# Patient Record
Sex: Female | Born: 2000
Health system: Southern US, Community
[De-identification: ages and names within clinical notes are randomized; demographics above are authoritative.]

## PROBLEM LIST (undated history)

## (undated) DIAGNOSIS — L309 Dermatitis, unspecified: Secondary | ICD-10-CM

## (undated) DIAGNOSIS — J45909 Unspecified asthma, uncomplicated: Secondary | ICD-10-CM

## (undated) DIAGNOSIS — T148XXA Other injury of unspecified body region, initial encounter: Secondary | ICD-10-CM

## (undated) HISTORY — PX: HERNIA REPAIR: SHX51

---
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
## 2021-05-22 ENCOUNTER — Emergency Department (HOSPITAL_COMMUNITY)
Admission: EM | Admit: 2021-05-22 | Discharge: 2021-05-22 | Disposition: A | Discharge status: Home / Self Care | Payer: Medicaid Other | Attending: Emergency Medicine | Admitting: Emergency Medicine

## 2021-05-22 ENCOUNTER — Encounter (HOSPITAL_COMMUNITY): Payer: Self-pay

## 2021-05-22 DIAGNOSIS — Z20822 Contact with and (suspected) exposure to covid-19: Secondary | ICD-10-CM | POA: Diagnosis not present

## 2021-05-22 DIAGNOSIS — J029 Acute pharyngitis, unspecified: Secondary | ICD-10-CM | POA: Diagnosis not present

## 2021-05-22 DIAGNOSIS — M791 Myalgia, unspecified site: Secondary | ICD-10-CM | POA: Diagnosis not present

## 2021-05-22 LAB — RESP PANEL BY RT-PCR (FLU A&B, COVID) ARPGX2
Influenza A by PCR: NEGATIVE
Influenza B by PCR: NEGATIVE
SARS Coronavirus 2 by RT PCR: NEGATIVE

## 2021-05-22 LAB — GROUP A STREP BY PCR: Group A Strep by PCR: NOT DETECTED

## 2021-05-22 MED ORDER — LIDOCAINE VISCOUS HCL 2 % MT SOLN
15.0000 mL | Freq: Once | OROMUCOSAL | Status: AC
  Administered 2021-05-22: 15 mL via ORAL
  Filled 2021-05-22: qty 15

## 2021-05-22 MED ORDER — ALUM & MAG HYDROXIDE-SIMETH 200-200-20 MG/5ML PO SUSP
30.0000 mL | Freq: Once | ORAL | Status: AC
  Administered 2021-05-22: 30 mL via ORAL
  Filled 2021-05-22: qty 30

## 2021-05-22 MED ORDER — PENICILLIN G BENZATHINE 1200000 UNIT/2ML IM SUSY
1.2000 10*6.[IU] | PREFILLED_SYRINGE | Freq: Once | INTRAMUSCULAR | Status: AC
  Administered 2021-05-22: 1.2 10*6.[IU] via INTRAMUSCULAR
  Filled 2021-05-22: qty 2

## 2021-05-22 MED ORDER — PREDNISONE 20 MG PO TABS
60.0000 mg | ORAL_TABLET | Freq: Once | ORAL | Status: AC
  Administered 2021-05-22: 60 mg via ORAL
  Filled 2021-05-22: qty 3

## 2021-05-22 NOTE — ED Provider Notes (Signed)
?[facility] DEPT ?Provider Note ? ? ?CSN: [id number] ?Arrival date & time: 05/22/21  1757 ? ?  ? ?History ? ?Chief Complaint  ?Patient presents with  ? Generalized Body Aches  ? ? ?Saanvika Mcphaul is a 21 y.o. female. ? ?21 y.o female with no PMH presents to the ED with a chief complaint of body aches, sore throat that is been ongoing for the past 2 nights.  Reports worsening pain with any swelling, any type of eating.  Also reports consistent body aches, decrease in p.o. intake.  Has taken Tylenol for pain without much improvement in symptoms.  Patient does have 2 younger siblings at home who are not sick with the same symptoms do go to school.  She denies any cough.  No chest pain, no shortness of breath, no dental complaints. ? ?The history is provided by the patient and medical records.  ? ?  ? ?Home Medications ?Prior to Admission medications   ?Medication Sig Start Date End Date Taking? Authorizing Provider  ?albuterol (VENTOLIN HFA) 108 (90 Base) MCG/ACT inhaler Inhale 1-2 puffs into the lungs every 6 (six) hours as needed for wheezing or shortness of breath. 04/01/19   Jaynee Eagles, PA-C  ?ibuprofen (ADVIL) 800 MG tablet Take 1 tablet (800 mg total) by mouth 3 (three) times daily. 11/13/20   Pearson Forster, NP  ?   ? ?Allergies    ?Patient has no known allergies.   ? ?Review of Systems   ?Review of Systems  ?Constitutional:  Positive for chills. Negative for fever.  ?HENT:  Positive for sore throat.   ?Respiratory:  Negative for cough and shortness of breath.   ? ?Physical Exam ?Updated Vital Signs ?BP 121/85 (BP Location: Left Arm)   Pulse (!) 118   Temp 98.6 ?F (37 ?C) (Oral)   Resp 18   Ht 5\' 4"  (1.626 m)   Wt 110.7 kg   SpO2 100%   BMI 41.88 kg/m?  ?Physical Exam ?Vitals and nursing note reviewed.  ?Constitutional:   ?   Appearance: She is ill-appearing.  ?HENT:  ?   Head: Normocephalic and atraumatic.  ?   Mouth/Throat:  ?   Mouth: Mucous membranes are moist.  ?    Pharynx: Uvula midline. Oropharyngeal exudate and posterior oropharyngeal erythema present.  ?   Tonsils: Tonsillar exudate present. 3+ on the right. 3+ on the left.  ?   Comments: 3 + symmetric, kissing the uvula which is midline.  Tolerating her secretions.  No PTA noted.  Cervical adenopathy palpated. ?Cardiovascular:  ?   Rate and Rhythm: Normal rate.  ?Pulmonary:  ?   Effort: Pulmonary effort is normal.  ?Abdominal:  ?   Tenderness: There is no abdominal tenderness.  ?Musculoskeletal:  ?   Cervical back: Normal range of motion and neck supple.  ?Skin: ?   General: Skin is warm and dry.  ?Neurological:  ?   Mental Status: She is alert and oriented to person, place, and time.  ? ? ?ED Results / Procedures / Treatments   ?Labs ?(all labs ordered are listed, but only abnormal results are displayed) ?Labs Reviewed  ?RESP PANEL BY RT-PCR (FLU A&B, COVID) ARPGX2  ?GROUP A STREP BY PCR  ? ? ?EKG ?None ? ?Radiology ?No results found. ? ?Procedures ?Procedures  ? ? ?Medications Ordered in ED ?Medications  ?predniSONE (DELTASONE) tablet 60 mg (60 mg Oral Given 05/22/21 1923)  ?penicillin g benzathine (BICILLIN LA) 1200000 UNIT/2ML injection 1.2 Million  Units (1.2 Million Units Intramuscular Given 05/22/21 2138)  ?alum & mag hydroxide-simeth (MAALOX/MYLANTA) 200-200-20 MG/5ML suspension 30 mL (30 mLs Oral Given 05/22/21 2138)  ?  And  ?lidocaine (XYLOCAINE) 2 % viscous mouth solution 15 mL (15 mLs Oral Given 05/22/21 2138)  ? ? ?ED Course/ Medical Decision Making/ A&P ?  ?                        ?Medical Decision Making ?Risk ?OTC drugs. ?Prescription drug management. ? ? ?This patient presents to the ED for concern of sore throat, this involves a number of treatment options, and is a complaint that carries with it a high risk of complications and morbidity.  The differential diagnosis includes PTA, strep pharyngitis, tonsillitis, viral etiology ? ? ?Co morbidities: ?Discussed in HPI ? ? ?Brief History: ? ?Healthy  21 year old female with sore throat for the past 2 days, body aches, decrease in p.o. intake.  Headache, chills. ? ?EMR reviewed including pt PMHx, past surgical history and past visits to ER.  ? ?See HPI for more details ? ?Visualization of her oropharynx reveals erythema, uvula is midline, 3+ tonsils are kissing.  She is tolerating her secretions.  No PTA visualized.  Erythema is noted. ? ? ?Medicines ordered: ? ?I ordered medication including maalox for sore throat, steroids to help with swelling.  Also given Bicillin to help treat her strep pharyngitis. ?Reevaluation of the patient after these medicines showed that the patient improved ?I have reviewed the patients home medicines and have made adjustments as needed ? ?Reevaluation: ? ?After the interventions noted above I re-evaluated patient and found that they have :improved ? ? ?Social Determinants of Health: ? ?The patient's social determinants of health were a factor in the care of this patient ? ? ? ?Problem List / ED Course: ? ?Patient here with worsening throat that is been ongoing for couple days.  Attempted to take over-the-counter medication without much improvement.  Also endorsing some chills.  Oropharynx is remarkable for bilateral exudates, 3+ tonsils symmetric, no PTA visualized.  She is tolerating her secretions adequately.  Tachycardia to the 118's here, no pyrexia present.  Respiratory panel negative for COVID-19, influenza.  Negative for strep pharyngitis.  Although, testing negative today I do feel that patient's symptoms consistent with pharyngitis.  Specially with 2 younger siblings at home.  She was given the shared decision of being treated today with Bicillin versus outpatient antibiotics.  She is agreeable to treatment at this time. ? ? ?Dispostion: ? ?After consideration of the diagnostic results and the patients response to treatment, I feel that the patent would and if it for continuing over-the-counter medication after Bicillin  injection, will need to follow-up with PCP in 1 week.  B ? ?Portions of this note were generated with [employer]. Dictation errors may occur despite best attempts at proofreading.   ?Final Clinical Impression(s) / ED Diagnoses ?Final diagnoses:  ?Sore throat  ? ? ?Rx / DC Orders ?ED Discharge Orders   ? ? None  ? ?  ? ? ?  ?Janeece Fitting, PA-C ?05/22/21 2145 ? ?  ?Davonna Belling, MD ?05/22/21 2329 ? ?

## 2021-05-22 NOTE — Discharge Instructions (Signed)
You were treated for strep pharyngitis on todays visit. You may continue to alternate tylenol and ibuprofen to help with your symptoms. ? ?If you experience any fever, worsening symptoms please return to the ED.  ?

## 2021-12-01 ENCOUNTER — Encounter (HOSPITAL_COMMUNITY): Payer: Self-pay

## 2021-12-01 ENCOUNTER — Ambulatory Visit (HOSPITAL_COMMUNITY)
Admission: RE | Admit: 2021-12-01 | Discharge: 2021-12-01 | Disposition: A | Discharge status: Home / Self Care | Payer: Medicaid Other | Source: Ambulatory Visit | Attending: Physician Assistant | Admitting: Physician Assistant

## 2021-12-01 VITALS — BP 114/79 | HR 73 | Temp 98.5°F | Resp 16

## 2021-12-01 DIAGNOSIS — Z113 Encounter for screening for infections with a predominantly sexual mode of transmission: Secondary | ICD-10-CM

## 2021-12-01 DIAGNOSIS — J069 Acute upper respiratory infection, unspecified: Secondary | ICD-10-CM | POA: Diagnosis not present

## 2021-12-01 DIAGNOSIS — Z20822 Contact with and (suspected) exposure to covid-19: Secondary | ICD-10-CM | POA: Insufficient documentation

## 2021-12-01 DIAGNOSIS — R059 Cough, unspecified: Secondary | ICD-10-CM | POA: Diagnosis not present

## 2021-12-01 LAB — BASIC METABOLIC PANEL
Anion gap: 7 (ref 5–15)
BUN: 8 mg/dL (ref 6–20)
CO2: 23 mmol/L (ref 22–32)
Calcium: 9.2 mg/dL (ref 8.9–10.3)
Chloride: 105 mmol/L (ref 98–111)
Creatinine, Ser: 0.93 mg/dL (ref 0.44–1.00)
GFR, Estimated: >60 mL/min (ref >60)
Glucose, Bld: 97 mg/dL (ref 70–99)
Potassium: 4.1 mmol/L (ref 3.5–5.1)
Sodium: 135 mmol/L (ref 135–145)

## 2021-12-01 LAB — SARS CORONAVIRUS 2 (TAT 6-24 HRS): SARS Coronavirus 2: NEGATIVE

## 2021-12-01 LAB — RPR: RPR Ser Ql: NONREACTIVE

## 2021-12-01 LAB — HIV ANTIBODY (ROUTINE TESTING W REFLEX): HIV Screen 4th Generation wRfx: NONREACTIVE

## 2021-12-01 NOTE — Discharge Instructions (Signed)
We will contact you if your COVID test is positive.  Because of your history of asthma you would be a candidate for Paxlovid.  If you are interested in starting this please let our nurses know when they call you with your result (if positive).  They will not call you if you are negative.  Continue over-the-counter medications.  Make sure you rest and drink plenty fluid.  If symptoms do not improve by next week please return for reevaluation.  If anything worsens you need to be seen immediately.  We will contact you if any of your other testing is positive.  Please abstain from sexual activity until results are available.  You should use condoms to sexual encounter.  If you develop any significant symptoms including abdominal pain, pelvic pain, vaginal discharge you should be seen immediately.

## 2021-12-01 NOTE — ED Provider Notes (Signed)
[facility]    CSN: [id number] Arrival date & time: 12/01/21  0845      History   Chief Complaint Chief Complaint  Patient presents with   Sore Throat    HPI Carolyn Bell is a 21 y.o. female.   Patient presents today with several concerns.  Her primary concern today is a 2-day history of URI symptoms including sore throat, congestion, cough.  Denies any fever, chest pain, shortness of breath, nausea, vomiting, diarrhea.  Reports that she was recently at a family gathering where multiple people have since tested positive for COVID.  She has not had COVID in the past.  She has not had COVID-vaccine.  She has been using over-the-counter medications including ibuprofen without improvement of symptoms.  She does have a history of asthma but has not required albuterol since symptoms began.  Denies any recent steroids or antibiotics.  In addition, patient reports that she is interested in being tested for STIs.  She denies any significant symptoms but reports that she recently had a yeast infection and wants to make sure that this is gone.  She also would like to be tested for all STIs.  She has no specific concerns or exposures.  Denies any significant symptoms.    Past Medical History:  Diagnosis Date   Asthma    Broken bones    Eczema     There are no problems to display for this patient.   Past Surgical History:  Procedure Laterality Date   BREAST REDUCTION SURGERY Bilateral 10/05/2019   Procedure: MAMMARY REDUCTION  (BREAST) left - 1812 grams, right - 1705 grams;  Surgeon: Cindra Presume, MD;  Location: [facility];  Service: Plastics;  Laterality: Bilateral;  2.5 hours, please   HERNIA REPAIR      OB History   No obstetric history on file.      Home Medications    Prior to Admission medications   Medication Sig Start Date End Date Taking? Authorizing Provider  albuterol (VENTOLIN HFA) 108 (90 Base) MCG/ACT inhaler Inhale 1-2  puffs into the lungs every 6 (six) hours as needed for wheezing or shortness of breath. 04/01/19   Jaynee Eagles, PA-C  ibuprofen (ADVIL) 800 MG tablet Take 1 tablet (800 mg total) by mouth 3 (three) times daily. 11/13/20   Pearson Forster, NP    Family History Family History  Problem Relation Age of Onset   Hypertension Mother    Hypertension Maternal Grandmother    Hypertension Maternal Grandfather    Hypertension Paternal Grandfather     Social History Social History   Tobacco Use   Smoking status: Never   Smokeless tobacco: Never  Substance Use Topics   Alcohol use: Yes    Comment: occassionally   Drug use: Yes    Types: Marijuana    Comment: occassional     Allergies   Patient has no known allergies.   Review of Systems Review of Systems  Constitutional:  Positive for activity change. Negative for appetite change, fatigue and fever.  HENT:  Positive for postnasal drip and sore throat. Negative for congestion, sinus pressure and sneezing.   Respiratory:  Positive for cough. Negative for shortness of breath.   Cardiovascular:  Negative for chest pain.  Gastrointestinal:  Negative for abdominal pain, diarrhea, nausea and vomiting.  Genitourinary:  Negative for pelvic pain, vaginal bleeding, vaginal discharge and vaginal pain.  Neurological:  Negative for dizziness, light-headedness and headaches.  Physical Exam Triage Vital Signs ED Triage Vitals  Enc Vitals Group     BP 12/01/21 0935 114/79     Pulse Rate 12/01/21 0931 73     Resp 12/01/21 0931 16     Temp 12/01/21 0931 98.5 F (36.9 C)     Temp Source 12/01/21 0931 Oral     SpO2 12/01/21 0931 97 %     Weight --      Height --      Head Circumference --      Peak Flow --      Pain Score 12/01/21 0933 0     Pain Loc --      Pain Edu? --      Excl. in [place]? --    No data found.  Updated Vital Signs BP 114/79 (BP Location: Left Arm)   Pulse 73   Temp 98.5 F (36.9 C) (Oral)   Resp 16   LMP  11/18/2021   SpO2 97%   Visual Acuity Right Eye Distance:   Left Eye Distance:   Bilateral Distance:    Right Eye Near:   Left Eye Near:    Bilateral Near:     Physical Exam Vitals reviewed.  Constitutional:      General: She is awake. She is not in acute distress.    Appearance: Normal appearance. She is well-developed. She is not ill-appearing.     Comments: Very pleasant female appears stated age in no acute distress sitting comfortably in exam room  HENT:     Head: Normocephalic and atraumatic.     Right Ear: Tympanic membrane, ear canal and external ear normal. Tympanic membrane is not erythematous or bulging.     Left Ear: Tympanic membrane, ear canal and external ear normal. Tympanic membrane is not erythematous or bulging.     Nose:     Right Sinus: No maxillary sinus tenderness or frontal sinus tenderness.     Left Sinus: No maxillary sinus tenderness or frontal sinus tenderness.     Mouth/Throat:     Pharynx: Uvula midline. Posterior oropharyngeal erythema present. No oropharyngeal exudate.  Cardiovascular:     Rate and Rhythm: Normal rate and regular rhythm.     Heart sounds: Normal heart sounds, S1 normal and S2 normal. No murmur heard. Pulmonary:     Effort: Pulmonary effort is normal.     Breath sounds: Normal breath sounds. No wheezing, rhonchi or rales.     Comments: Clear to auscultation bilaterally Psychiatric:        Behavior: Behavior is cooperative.      UC Treatments / Results  Labs (all labs ordered are listed, but only abnormal results are displayed) Labs Reviewed  SARS CORONAVIRUS 2 (TAT 6-24 HRS)  BASIC METABOLIC PANEL  HIV ANTIBODY (ROUTINE TESTING W REFLEX)  RPR  CERVICOVAGINAL ANCILLARY ONLY    EKG   Radiology No results found.  Procedures Procedures (including critical care time)  Medications Ordered in UC Medications - No data to display  Initial Impression / Assessment and Plan / UC Course  I have reviewed the triage  vital signs and the nursing notes.  Pertinent labs & imaging results that were available during my care of the patient were reviewed by me and considered in my medical decision making (see chart for details).     Concern for viral etiology given clinical presentation.  Patient is well-appearing, afebrile, nontoxic, nontachycardic.  Given exposure to COVID we will test for this.  She is  within 5 days of symptom onset and with a history of asthma a candidate for antiviral therapy (Paxlovid).  We do not have a recent EGFR available so this was drawn with additional lab work today.  She would not require any medication adjustment based on today's medication list.  Encouraged her to continue over-the-counter medication for symptom management.  Offered prescription cough medication but patient did not think this was necessary as over-the-counter medications have adequately been managing her symptoms.  She was encouraged to rest and drink plenty fluid.  She was provided a work excuse note with current CDC return to work guidelines based on COVID test result.  If her symptoms are improving by next week she is to return for reevaluation.  If she has any worsening symptoms she needs to be seen immediately to which she expressed understanding.  STI panel including cytology swab, HIV, RPR obtained today.  We will contact patient with any positive results to arrange treatment.  She was instructed to avoid sexual contact until results are available.  Final Clinical Impressions(s) / UC Diagnoses   Final diagnoses:  Viral URI with cough  Routine screening for STI (sexually transmitted infection)     Discharge Instructions      We will contact you if your COVID test is positive.  Because of your history of asthma you would be a candidate for Paxlovid.  If you are interested in starting this please let our nurses know when they call you with your result (if positive).  They will not call you if you are negative.   Continue over-the-counter medications.  Make sure you rest and drink plenty fluid.  If symptoms do not improve by next week please return for reevaluation.  If anything worsens you need to be seen immediately.  We will contact you if any of your other testing is positive.  Please abstain from sexual activity until results are available.  You should use condoms to sexual encounter.  If you develop any significant symptoms including abdominal pain, pelvic pain, vaginal discharge you should be seen immediately.     ED Prescriptions   None    PDMP not reviewed this encounter.   Terrilee Croak, PA-C 12/01/21 8657

## 2021-12-01 NOTE — ED Triage Notes (Signed)
Pt states sore throat and congested for the past 2 days states she was exposed to covid. Pt states she wants to be tested for STD as well.

## 2021-12-03 LAB — CERVICOVAGINAL ANCILLARY ONLY
Bacterial Vaginitis (gardnerella): NEGATIVE
Candida Glabrata: NEGATIVE
Candida Vaginitis: POSITIVE — AB
Chlamydia: NEGATIVE
Comment: NEGATIVE
Comment: NEGATIVE
Comment: NEGATIVE
Comment: NEGATIVE
Comment: NEGATIVE
Comment: NORMAL
Neisseria Gonorrhea: NEGATIVE
Trichomonas: NEGATIVE

## 2021-12-05 ENCOUNTER — Telehealth (HOSPITAL_COMMUNITY): Payer: Self-pay

## 2021-12-05 MED ORDER — FLUCONAZOLE 150 MG PO TABS: 150.0000 mg | ORAL_TABLET | Freq: Every day | ORAL | 0 refills | Status: DC

## 2022-02-02 ENCOUNTER — Encounter (HOSPITAL_COMMUNITY): Payer: Self-pay

## 2022-02-02 ENCOUNTER — Other Ambulatory Visit: Payer: Self-pay

## 2022-02-02 ENCOUNTER — Emergency Department (HOSPITAL_COMMUNITY)
Admission: EM | Admit: 2022-02-02 | Discharge: 2022-02-02 | Disposition: A | Discharge status: Home / Self Care | Payer: Medicaid Other | Attending: Emergency Medicine | Admitting: Emergency Medicine

## 2022-02-02 DIAGNOSIS — Z7952 Long term (current) use of systemic steroids: Secondary | ICD-10-CM | POA: Diagnosis not present

## 2022-02-02 DIAGNOSIS — J101 Influenza due to other identified influenza virus with other respiratory manifestations: Secondary | ICD-10-CM | POA: Insufficient documentation

## 2022-02-02 DIAGNOSIS — R509 Fever, unspecified: Secondary | ICD-10-CM | POA: Diagnosis present

## 2022-02-02 DIAGNOSIS — Z20822 Contact with and (suspected) exposure to covid-19: Secondary | ICD-10-CM | POA: Insufficient documentation

## 2022-02-02 DIAGNOSIS — Z7951 Long term (current) use of inhaled steroids: Secondary | ICD-10-CM | POA: Insufficient documentation

## 2022-02-02 DIAGNOSIS — J45909 Unspecified asthma, uncomplicated: Secondary | ICD-10-CM | POA: Diagnosis not present

## 2022-02-02 DIAGNOSIS — J111 Influenza due to unidentified influenza virus with other respiratory manifestations: Secondary | ICD-10-CM

## 2022-02-02 LAB — RESP PANEL BY RT-PCR (FLU A&B, COVID) ARPGX2
Influenza A by PCR: POSITIVE — AB
Influenza B by PCR: NEGATIVE
SARS Coronavirus 2 by RT PCR: NEGATIVE

## 2022-02-02 MED ORDER — ALBUTEROL SULFATE HFA 108 (90 BASE) MCG/ACT IN AERS
2.0000 | INHALATION_SPRAY | RESPIRATORY_TRACT | Status: AC
  Administered 2022-02-02 (×2): 2 via RESPIRATORY_TRACT
  Filled 2022-02-02: qty 6.7

## 2022-02-02 MED ORDER — PREDNISONE 10 MG PO TABS: 40.0000 mg | ORAL_TABLET | Freq: Every day | ORAL | 0 refills | Status: DC

## 2022-02-02 MED ORDER — PREDNISONE 20 MG PO TABS
60.0000 mg | ORAL_TABLET | Freq: Once | ORAL | Status: AC
  Administered 2022-02-02: 60 mg via ORAL
  Filled 2022-02-02: qty 3

## 2022-02-02 MED ORDER — ONDANSETRON 8 MG PO TBDP: 8.0000 mg | ORAL_TABLET | Freq: Three times a day (TID) | ORAL | 0 refills | Status: DC | PRN

## 2022-02-02 MED ORDER — ACETAMINOPHEN 500 MG PO TABS: 500.0000 mg | ORAL_TABLET | Freq: Four times a day (QID) | ORAL | 0 refills | Status: AC | PRN

## 2022-02-02 MED ORDER — ONDANSETRON 8 MG PO TBDP
8.0000 mg | ORAL_TABLET | Freq: Once | ORAL | Status: AC
  Administered 2022-02-02: 8 mg via ORAL
  Filled 2022-02-02: qty 1

## 2022-02-02 MED ORDER — ACETAMINOPHEN 325 MG PO TABS
650.0000 mg | ORAL_TABLET | Freq: Once | ORAL | Status: AC | PRN
  Administered 2022-02-02: 650 mg via ORAL
  Filled 2022-02-02: qty 2

## 2022-02-02 NOTE — Discharge Instructions (Addendum)
You were seen in the emergency room for flulike symptoms, and your flu test is positive.  Make sure you hydrate well. Take Tylenol or ibuprofen for fevers, body aches. Given the asthma exacerbation, you have been given prednisone.  Use the inhaler every 4 hours as needed for shortness of breath.  Return to the ER if your symptoms get worse.

## 2022-02-02 NOTE — ED Notes (Signed)
Pt tolerating po's without difficulty 

## 2022-02-02 NOTE — ED Triage Notes (Signed)
Patient c/o a productive cough with clear sputum, fever, and vomiting x 6 days ago.

## 2022-02-02 NOTE — ED Provider Notes (Signed)
[facility]-EMERGENCY DEPT Provider Note   CSN: [id number] Arrival date & time: 02/02/22  0757     History  Chief Complaint  Patient presents with   Cough   Fever   Emesis    Carolyn Bell is a 21 y.o. female.  HPI    21 year old female with history of asthma comes in with chief complaint of cough, fevers and emesis.  Patient has history of asthma.  She states that she started having common cold about a week ago, but 3 days ago her symptoms started getting worse.  Last night she started having sweats and fevers.  Patient is having cough with clear sputum and also having nausea with vomiting.  Home Medications Prior to Admission medications   Medication Sig Start Date End Date Taking? Authorizing Provider  acetaminophen (TYLENOL) 500 MG tablet Take 1 tablet (500 mg total) by mouth every 6 (six) hours as needed. 02/02/22  Yes Jaion Lagrange, MD  ondansetron (ZOFRAN-ODT) 8 MG disintegrating tablet Take 1 tablet (8 mg total) by mouth every 8 (eight) hours as needed for nausea. 02/02/22  Yes Vika Buske, MD  predniSONE (DELTASONE) 10 MG tablet Take 4 tablets (40 mg total) by mouth daily. 02/02/22  Yes Derwood Kaplan, MD  albuterol (VENTOLIN HFA) 108 (90 Base) MCG/ACT inhaler Inhale 1-2 puffs into the lungs every 6 (six) hours as needed for wheezing or shortness of breath. 04/01/19   Wallis Bamberg, PA-C  fluconazole (DIFLUCAN) 150 MG tablet Take 1 tablet (150 mg total) by mouth daily. 12/05/21   Merrilee Jansky, MD  ibuprofen (ADVIL) 800 MG tablet Take 1 tablet (800 mg total) by mouth 3 (three) times daily. 11/13/20   Ivette Loyal, NP      Allergies    Patient has no known allergies.    Review of Systems   Review of Systems  Physical Exam Updated Vital Signs BP 137/88   Pulse (!) 116   Temp 99.5 F (37.5 C) (Oral)   Resp 17   Ht 5\' 4"  (1.626 m)   Wt 108.9 kg   LMP 01/29/2022   SpO2 96%   BMI 41.20 kg/m  Physical Exam Vitals and  nursing note reviewed.  Constitutional:      Appearance: She is well-developed.  HENT:     Head: Atraumatic.  Eyes:     Extraocular Movements: Extraocular movements intact.     Pupils: Pupils are equal, round, and reactive to light.  Cardiovascular:     Rate and Rhythm: Normal rate.  Pulmonary:     Effort: Pulmonary effort is normal.     Breath sounds: Wheezing present.     Comments: Mild expiratory wheezing diffusely Musculoskeletal:     Cervical back: Normal range of motion and neck supple.  Skin:    General: Skin is warm and dry.  Neurological:     Mental Status: She is alert and oriented to person, place, and time.     ED Results / Procedures / Treatments   Labs (all labs ordered are listed, but only abnormal results are displayed) Labs Reviewed  RESP PANEL BY RT-PCR (FLU A&B, COVID) ARPGX2 - Abnormal; Notable for the following components:      Result Value   Influenza A by PCR POSITIVE (*)    All other components within normal limits    EKG None  Radiology No results found.  Procedures Procedures    Medications Ordered in ED Medications  acetaminophen (TYLENOL) tablet 650 mg (650 mg  Oral Given 02/02/22 0815)  ondansetron (ZOFRAN-ODT) disintegrating tablet 8 mg (8 mg Oral Given 02/02/22 0833)  albuterol (VENTOLIN HFA) 108 (90 Base) MCG/ACT inhaler 2 puff (2 puffs Inhalation Given 02/02/22 0906)  predniSONE (DELTASONE) tablet 60 mg (60 mg Oral Given 02/02/22 5465)    ED Course/ Medical Decision Making/ A&P Clinical Course as of 02/02/22 0957  Fri Feb 02, 2022  0957 Pt reassessed. Pt's VSS and WNL. Pt's cap refill < 3 seconds. Pt has been hydrated in the ER and now passed po challenge. We will discharge with antiemetic. Strict ER return precautions have been discussed and pt will return if he is unable to tolerate fluids and symptoms are getting worse.  [AN]    Clinical Course User Index [AN] Derwood Kaplan, MD                           Medical  Decision Making Risk OTC drugs. Prescription drug management.   21 year old patient comes in with chief complaint of URI-like symptoms with fevers, chills, nausea, vomiting.  He is also having bodyaches.  She has history of asthma.  Differential diagnosis includes asthma exacerbation, URI, COVID-19 or influenza-like illness.  Given the constellation of symptoms, severe nausea and vomiting we will start p.o. challenge, give her inhaler, give her Tylenol and reassess.  COVID-19 test and flu test sent.  9:58 AM Patient reassessed.  Flu test is positive.  She states that after the Tylenol, she feels better.  After the inhaler, her breathing is better.  Her itching is resolved.  Aches are better. Discussed with her that her flu is positive, likely leading to asthma exacerbation.  She will take prednisone for 3 days.  Inhalers every 4 hours.  Inhaler provided in the emergency room.  Return precautions discussed.  Work note provided.  Final Clinical Impression(s) / ED Diagnoses Final diagnoses:  Influenza    Rx / DC Orders ED Discharge Orders          Ordered    predniSONE (DELTASONE) 10 MG tablet  Daily        02/02/22 0954    acetaminophen (TYLENOL) 500 MG tablet  Every 6 hours PRN        02/02/22 0954    ondansetron (ZOFRAN-ODT) 8 MG disintegrating tablet  Every 8 hours PRN        02/02/22 0957              Derwood Kaplan, MD 02/02/22 [phone]

## 2022-03-29 ENCOUNTER — Other Ambulatory Visit: Payer: Self-pay

## 2022-03-29 ENCOUNTER — Telehealth: Payer: Self-pay | Admitting: *Deleted

## 2022-03-29 ENCOUNTER — Inpatient Hospital Stay (HOSPITAL_COMMUNITY): Payer: Medicaid Other

## 2022-03-29 ENCOUNTER — Inpatient Hospital Stay (HOSPITAL_COMMUNITY)
Admission: AD | Admit: 2022-03-29 | Discharge: 2022-03-29 | Disposition: A | Discharge status: Home / Self Care | Payer: Medicaid Other | Attending: Obstetrics & Gynecology | Admitting: Obstetrics & Gynecology

## 2022-03-29 ENCOUNTER — Encounter (HOSPITAL_COMMUNITY): Payer: Self-pay | Admitting: Obstetrics & Gynecology

## 2022-03-29 DIAGNOSIS — O23593 Infection of other part of genital tract in pregnancy, third trimester: Secondary | ICD-10-CM | POA: Diagnosis not present

## 2022-03-29 DIAGNOSIS — B3731 Acute candidiasis of vulva and vagina: Secondary | ICD-10-CM | POA: Diagnosis not present

## 2022-03-29 DIAGNOSIS — O26893 Other specified pregnancy related conditions, third trimester: Secondary | ICD-10-CM | POA: Diagnosis present

## 2022-03-29 DIAGNOSIS — Z3490 Encounter for supervision of normal pregnancy, unspecified, unspecified trimester: Secondary | ICD-10-CM

## 2022-03-29 DIAGNOSIS — Z3A30 30 weeks gestation of pregnancy: Secondary | ICD-10-CM | POA: Diagnosis not present

## 2022-03-29 LAB — COMPREHENSIVE METABOLIC PANEL
ALT: 17 U/L (ref 0–44)
AST: 16 U/L (ref 15–41)
Albumin: 3.4 g/dL — ABNORMAL LOW (ref 3.5–5.0)
Alkaline Phosphatase: 53 U/L (ref 38–126)
Anion gap: 5 (ref 5–15)
BUN: 10 mg/dL (ref 6–20)
CO2: 26 mmol/L (ref 22–32)
Calcium: 8.8 mg/dL — ABNORMAL LOW (ref 8.9–10.3)
Chloride: 104 mmol/L (ref 98–111)
Creatinine, Ser: 0.88 mg/dL (ref 0.44–1.00)
GFR, Estimated: >60 mL/min (ref >60)
Glucose, Bld: 96 mg/dL (ref 70–99)
Potassium: 4 mmol/L (ref 3.5–5.1)
Sodium: 135 mmol/L (ref 135–145)
Total Bilirubin: 0.8 mg/dL (ref 0.3–1.2)
Total Protein: 6.9 g/dL (ref 6.5–8.1)

## 2022-03-29 LAB — CBC
HCT: 38.6 % (ref 36.0–46.0)
Hemoglobin: 12.2 g/dL (ref 12.0–15.0)
MCH: 28.2 pg (ref 26.0–34.0)
MCHC: 31.6 g/dL (ref 30.0–36.0)
MCV: 89.1 fL (ref 80.0–100.0)
Platelets: 310 10*3/uL (ref 150–400)
RBC: 4.33 MIL/uL (ref 3.87–5.11)
RDW: 13.7 % (ref 11.5–15.5)
WBC: 8.5 10*3/uL (ref 4.0–10.5)
nRBC: 0 % (ref 0.0–0.2)

## 2022-03-29 LAB — HCG, QUANTITATIVE, PREGNANCY: hCG, Beta Chain, Quant, S: 9553 m[IU]/mL — ABNORMAL HIGH (ref <5)

## 2022-03-29 LAB — WET PREP, GENITAL
Sperm: NONE SEEN
Trich, Wet Prep: NONE SEEN
WBC, Wet Prep HPF POC: >=10 — AB (ref <10)

## 2022-03-29 LAB — URINALYSIS, ROUTINE W REFLEX MICROSCOPIC
Bilirubin Urine: NEGATIVE
Glucose, UA: NEGATIVE mg/dL
Hgb urine dipstick: NEGATIVE
Ketones, ur: NEGATIVE mg/dL
Leukocytes,Ua: NEGATIVE
Nitrite: NEGATIVE
Protein, ur: NEGATIVE mg/dL
Specific Gravity, Urine: 1.024 (ref 1.005–1.030)
pH: 7 (ref 5.0–8.0)

## 2022-03-29 LAB — ABO/RH: ABO/RH(D): O POS

## 2022-03-29 MED ORDER — TERCONAZOLE 0.8 % VA CREA: 1.0000 | TOPICAL_CREAM | Freq: Every day | VAGINAL | 0 refills | Status: AC

## 2022-03-29 NOTE — MAU Provider Note (Addendum)
History     CSN: [id number]  Arrival date and time: 03/29/22 1317   Event Date/Time   First Provider Initiated Contact with Patient 03/29/22 1424      Chief Complaint  Patient presents with   Spotting   HPI  Pt is a 22 year old female G1P0 at estimated gestation [redacted]w[redacted]d who presents for evaluation of vaginal spotting and cramping.  Pt reports  light vaginal spotting and associated cramping at 0600 this morning. States the spotting was originally light pink and has gotten progressively darker throughout the day. Bleeding has remianed light in nature.Denies associated dysuria, fever, chills, or vaginal discharge. She states the pain feels like her regular menstrual cramps localized to the bilateral inguinal region. States the pain has been intermittent throughout the day and remains at 3 out of 10 in severity. She has not tried anything for the pain. Nothing seems to make the pain better or worse.   Patient's last menstrual periods was 01/30/22. Flow occurs at regular monthly intervals lasting 5 days total. She has heavy flow the first 2 days with lighter flow following 3 days. States she had a positive pregnancy test with her PCP on 01/03. Has follow up scheduled on 01/30.   OB History     Gravida  1   Para      Term      Preterm      AB      Living         SAB      IAB      Ectopic      Multiple      Live Births              Past Medical History:  Diagnosis Date   Asthma    Broken bones    Eczema     Past Surgical History:  Procedure Laterality Date   BREAST REDUCTION SURGERY Bilateral 10/05/2019   Procedure: MAMMARY REDUCTION  (BREAST) left - 1812 grams, right - 1705 grams;  Surgeon: Allena Napoleon, MD;  Location: [facility];  Service: Plastics;  Laterality: Bilateral;  2.5 hours, please   HERNIA REPAIR      Family History  Problem Relation Age of Onset   Hypertension Mother    Hypertension Maternal Grandmother    Hypertension  Maternal Grandfather    Hypertension Paternal Grandfather     Social History   Tobacco Use   Smoking status: Never   Smokeless tobacco: Never  Vaping Use   Vaping Use: Former   Substances: Nicotine, Flavoring  Substance Use Topics   Alcohol use: Yes    Comment: occassionally   Drug use: Yes    Types: Marijuana    Comment: occassional    Allergies: No Known Allergies  Medications Prior to Admission  Medication Sig Dispense Refill Last Dose   acetaminophen (TYLENOL) 500 MG tablet Take 1 tablet (500 mg total) by mouth every 6 (six) hours as needed. 30 tablet 0    albuterol (VENTOLIN HFA) 108 (90 Base) MCG/ACT inhaler Inhale 1-2 puffs into the lungs every 6 (six) hours as needed for wheezing or shortness of breath. 18 g 0    fluconazole (DIFLUCAN) 150 MG tablet Take 1 tablet (150 mg total) by mouth daily. 2 tablet 0    ibuprofen (ADVIL) 800 MG tablet Take 1 tablet (800 mg total) by mouth 3 (three) times daily. 21 tablet 0    ondansetron (ZOFRAN-ODT) 8 MG disintegrating tablet Take 1 tablet (  8 mg total) by mouth every 8 (eight) hours as needed for nausea. 20 tablet 0    predniSONE (DELTASONE) 10 MG tablet Take 4 tablets (40 mg total) by mouth daily. 12 tablet 0     Review of Systems  Constitutional:  Negative for chills and fever.  Gastrointestinal:  Positive for abdominal pain.  Genitourinary:  Positive for vaginal bleeding. Negative for dysuria and vaginal discharge.   Physical Exam   Blood pressure 115/72, pulse 75, temperature 98.6 F (37 C), temperature source Oral, resp. rate 18, height 5\' 4"  (1.626 m), weight 106.3 kg, last menstrual period 01/29/2022, SpO2 100 %.  Physical Exam Constitutional:      Appearance: Normal appearance.  HENT:     Head: Normocephalic and atraumatic.  Pulmonary:     Effort: Pulmonary effort is normal.  Neurological:     Mental Status: She is alert and oriented to person, place, and time.  Psychiatric:        Mood and Affect: Mood normal.         Behavior: Behavior normal.     MAU Course  Procedures  MDM   Assessment and Plan  Pt is a 22 year old female G1P0 at estimated gestation [redacted]w[redacted]d who presents for evaluation of vaginal spotting and cramping.  Pt with 1 day of intermittent vaginal spotting and minor cramping of the lower abdomen. LNMP 11/21 with positive urine pregnancy test on 01/03 per PCP. Transvaginal US reveals early intrauterine gestational sac with estimated gestational age of [redacted] weeks 6 days. hCG 9,553. U/A and CBC were negative. Wet prep positive for clue cells and yeast.   Given US findings, ectopic pregnancy ruled out. Do not suspect STD as pt denies any associated fever, chills, dysuria or abnormal vaginal discharge. Will schedule follow up viability scan in 2 weeks. Informed patient to return with any new or worsening symptoms. Reports she is already taking pre-natal vitamins at home.   Given wet prep findings will begin on fluconazole.  1) Vaginal spotting and cramping       > viability scan in 2 weeks       > GC/Chlamydia pending  2) Yeast infection     > fluconazole        Marlan Palau 03/29/2022, 2:33 PM   CNM attestation:  I have seen and examined this patient; I agree with above documentation in the medical student's note.   Arryana Tolleson is a 22 y.o. G1P0 reporting vaginal spotting and abnormal discharge Denies VB, cramping, urinary symptoms, vaginal itching/burning.  PE: BP 115/72 (BP Location: Right Arm)   Pulse 75   Temp 98.6 F (37 C) (Oral)   Resp 18   Ht 5\' 4"  (1.626 m)   Wt 106.3 kg   LMP 01/29/2022   SpO2 100%   BMI 40.23 kg/m  Gen: calm comfortable, NAD Resp: normal effort, no distress Abd: soft, nontender  ROS, labs, PMH reviewed  Results for orders placed or performed during the hospital encounter of 03/29/22 (from the past 24 hour(s))  Urinalysis, Routine w reflex microscopic Urine, Clean Catch     Status: None   Collection Time: 03/29/22  1:53  PM  Result Value Ref Range   Color, Urine YELLOW YELLOW   APPearance CLEAR CLEAR   Specific Gravity, Urine 1.024 1.005 - 1.030   pH 7.0 5.0 - 8.0   Glucose, UA NEGATIVE NEGATIVE mg/dL   Hgb urine dipstick NEGATIVE NEGATIVE   Bilirubin Urine NEGATIVE NEGATIVE  Ketones, ur NEGATIVE NEGATIVE mg/dL   Protein, ur NEGATIVE NEGATIVE mg/dL   Nitrite NEGATIVE NEGATIVE   Leukocytes,Ua NEGATIVE NEGATIVE  Wet prep, genital     Status: Abnormal   Collection Time: 03/29/22  1:53 PM   Specimen: PATH Cytology Cervicovaginal Ancillary Only  Result Value Ref Range   Yeast Wet Prep HPF POC PRESENT (A) NONE SEEN   Trich, Wet Prep NONE SEEN NONE SEEN   Clue Cells Wet Prep HPF POC PRESENT (A) NONE SEEN   WBC, Wet Prep HPF POC >=10 (A) <10   Sperm NONE SEEN   ABO/Rh     Status: None   Collection Time: 03/29/22  2:06 PM  Result Value Ref Range   ABO/RH(D) O POS    No rh immune globuloin      NOT A RH IMMUNE GLOBULIN CANDIDATE, PT RH POSITIVE Performed at [facility] [address], [place], [place] [address]   hCG, quantitative, pregnancy     Status: Abnormal   Collection Time: 03/29/22  2:07 PM  Result Value Ref Range   hCG, Beta Chain, Quant, S 9,553 (H) <5 mIU/mL  CBC     Status: None   Collection Time: 03/29/22  2:07 PM  Result Value Ref Range   WBC 8.5 4.0 - 10.5 K/uL   RBC 4.33 3.87 - 5.11 MIL/uL   Hemoglobin 12.2 12.0 - 15.0 g/dL   HCT 38.6 36.0 - 46.0 %   MCV 89.1 80.0 - 100.0 fL   MCH 28.2 26.0 - 34.0 pg   MCHC 31.6 30.0 - 36.0 g/dL   RDW 13.7 11.5 - 15.5 %   Platelets 310 150 - 400 K/uL   nRBC 0.0 0.0 - 0.2 %  Comprehensive metabolic panel     Status: Abnormal   Collection Time: 03/29/22  2:07 PM  Result Value Ref Range   Sodium 135 135 - 145 mmol/L   Potassium 4.0 3.5 - 5.1 mmol/L   Chloride 104 98 - 111 mmol/L   CO2 26 22 - 32 mmol/L   Glucose, Bld 96 70 - 99 mg/dL   BUN 10 6 - 20 mg/dL   Creatinine, Ser 0.88 0.44 - 1.00 mg/dL   Calcium 8.8 (L) 8.9 - 10.3 mg/dL    Total Protein 6.9 6.5 - 8.1 g/dL   Albumin 3.4 (L) 3.5 - 5.0 g/dL   AST 16 15 - 41 U/L   ALT 17 0 - 44 U/L   Alkaline Phosphatase 53 38 - 126 U/L   Total Bilirubin 0.8 0.3 - 1.2 mg/dL   GFR, Estimated >60 >60 mL/min   Anion gap 5 5 - 15   Meds ordered this encounter  Medications   terconazole (TERAZOL 3) 0.8 % vaginal cream    Sig: Place 1 applicator vaginally at bedtime. Apply nightly for three nights.    Dispense:  20 g    Refill:  0    Order Specific Question:   Supervising Provider    Answer:   Woodroe Mode [[id number]]    A/P: --22 y.o. G1P0 with + GS on formal US --Vulvovaginal candidiasis, prescription to pharmacy  --Hgb 12.2, Rh POS --Discharge home in stable condition  F/U: --Order placed for repeat viability ultrasound in two weeks --Patient has New OB Intake Interview at [facility] on 04/10/2022  Mallie Snooks, [place], MSN, CNM Certified Nurse Midwife, [facility] for [employer], [place]

## 2022-03-29 NOTE — Telephone Encounter (Signed)
TC from pt reporting VB in early pregnancy. Advised pt to seek care in MAU. Instructions on location given. Pt verbalized understanding.

## 2022-03-29 NOTE — Discharge Instructions (Signed)

## 2022-03-29 NOTE — MAU Note (Signed)
Carolyn Bell is a 22 y.o. at Unknown here in MAU reporting: spotting and cramping that began this morning.  Reports VB is light pink and cramps are like menstrual cramps but not as bad. LMP: 01/31/2023 Onset of complaint: today Pain score: 3 Vitals:   03/29/22 1339  BP: 115/72  Pulse: 75  Resp: 18  Temp: 98.6 F (37 C)  SpO2: 100%     FHT:NA Lab orders placed from triage:   UA

## 2022-03-30 ENCOUNTER — Telehealth: Payer: Medicaid Other | Admitting: Family Medicine

## 2022-03-30 ENCOUNTER — Encounter: Payer: Self-pay | Admitting: Advanced Practice Midwife

## 2022-03-30 DIAGNOSIS — Z3490 Encounter for supervision of normal pregnancy, unspecified, unspecified trimester: Secondary | ICD-10-CM

## 2022-03-30 LAB — GC/CHLAMYDIA PROBE AMP (~~LOC~~) NOT AT ARMC
Chlamydia: POSITIVE — AB
Comment: NEGATIVE
Comment: NORMAL
Neisseria Gonorrhea: NEGATIVE

## 2022-03-30 NOTE — Progress Notes (Signed)
For the safety of you and your child, I recommend a face to face office visit with a health care provider.  Many mothers need to take medicines during their pregnancy and while nursing.  Almost all medicines pass into the breast milk in small quantities.  Most are generally considered safe for a mother to take but some medicines must be avoided.  After reviewing your E-Visit request, I recommend that you consult your OB/GYN or pediatrician for medical advice in relation to your condition and prescription medications while pregnant or breastfeeding.  NOTE:  There will be NO CHARGE for this eVisit  If you are having a true medical emergency please call 911.    For an urgent face to face visit, [facility] has six urgent care centers for your convenience:     [facility] [place] Get Driving Directions [phone] [address] [place], [place] [address]    [facility] ([place]) Get Driving Directions [phone] [address] [place], [place] [address]  [facility] ([place] [facility] [place]) Get Driving Directions [phone] [address] [place],  [place]  [address]  [facility] Get Driving Directions [phone] [address] [place], [place] [address]   [facility] [place] Get Driving Directions  [phone] [address] [place], [place] [address]   [facility] [place] Get Driving Directions [phone] [address] [place], [place] [address]  Your MyChart E-visit questionnaire answers were reviewed by a board certified advanced clinical practitioner to complete your personal care plan based on your specific symptoms.  Thank you for using e-Visits.   have provided 5 minutes of non face to face time during this encounter for chart review and documentation.       

## 2022-04-01 ENCOUNTER — Other Ambulatory Visit: Payer: Self-pay

## 2022-04-01 ENCOUNTER — Telehealth: Payer: Self-pay

## 2022-04-01 ENCOUNTER — Inpatient Hospital Stay (HOSPITAL_COMMUNITY)
Admission: AD | Admit: 2022-04-01 | Discharge: 2022-04-01 | Disposition: A | Discharge status: Home / Self Care | Payer: Medicaid Other | Attending: Family Medicine | Admitting: Family Medicine

## 2022-04-01 DIAGNOSIS — A749 Chlamydial infection, unspecified: Secondary | ICD-10-CM

## 2022-04-01 MED ORDER — AZITHROMYCIN 250 MG PO TABS: 1000.0000 mg | ORAL_TABLET | Freq: Once | ORAL | 0 refills | Status: AC

## 2022-04-01 NOTE — Telephone Encounter (Signed)
Patient contacted regarding positive chlamydia results. Name and DOB verified. Patient with no known drug allergies. Azithromycin sent to pharmacy. All questions answered.    Renee Harder, CNM 04/01/22, 9:17 AM

## 2022-04-04 ENCOUNTER — Inpatient Hospital Stay (HOSPITAL_COMMUNITY)
Admission: AD | Admit: 2022-04-04 | Discharge: 2022-04-04 | Disposition: A | Discharge status: Home / Self Care | Payer: Medicaid Other | Attending: Obstetrics and Gynecology | Admitting: Obstetrics and Gynecology

## 2022-04-04 ENCOUNTER — Inpatient Hospital Stay (HOSPITAL_COMMUNITY): Payer: Medicaid Other

## 2022-04-04 ENCOUNTER — Other Ambulatory Visit: Payer: Self-pay

## 2022-04-04 ENCOUNTER — Encounter (HOSPITAL_COMMUNITY): Payer: Self-pay | Admitting: Obstetrics and Gynecology

## 2022-04-04 DIAGNOSIS — B9689 Other specified bacterial agents as the cause of diseases classified elsewhere: Secondary | ICD-10-CM

## 2022-04-04 DIAGNOSIS — O039 Complete or unspecified spontaneous abortion without complication: Secondary | ICD-10-CM

## 2022-04-04 DIAGNOSIS — N760 Acute vaginitis: Secondary | ICD-10-CM

## 2022-04-04 DIAGNOSIS — Z3A09 9 weeks gestation of pregnancy: Secondary | ICD-10-CM | POA: Diagnosis not present

## 2022-04-04 DIAGNOSIS — O209 Hemorrhage in early pregnancy, unspecified: Secondary | ICD-10-CM

## 2022-04-04 DIAGNOSIS — O23593 Infection of other part of genital tract in pregnancy, third trimester: Secondary | ICD-10-CM | POA: Insufficient documentation

## 2022-04-04 LAB — URINALYSIS, ROUTINE W REFLEX MICROSCOPIC
Bilirubin Urine: NEGATIVE
Glucose, UA: NEGATIVE mg/dL
Ketones, ur: NEGATIVE mg/dL
Leukocytes,Ua: NEGATIVE
Nitrite: NEGATIVE
Protein, ur: NEGATIVE mg/dL
Specific Gravity, Urine: 1.024 (ref 1.005–1.030)
pH: 6 (ref 5.0–8.0)

## 2022-04-04 LAB — CBC
HCT: 38.6 % (ref 36.0–46.0)
Hemoglobin: 12 g/dL (ref 12.0–15.0)
MCH: 28 pg (ref 26.0–34.0)
MCHC: 31.1 g/dL (ref 30.0–36.0)
MCV: 90 fL (ref 80.0–100.0)
Platelets: 333 10*3/uL (ref 150–400)
RBC: 4.29 MIL/uL (ref 3.87–5.11)
RDW: 13.5 % (ref 11.5–15.5)
WBC: 6 10*3/uL (ref 4.0–10.5)
nRBC: 0 % (ref 0.0–0.2)

## 2022-04-04 LAB — WET PREP, GENITAL
Sperm: NONE SEEN
Trich, Wet Prep: NONE SEEN
WBC, Wet Prep HPF POC: >=10 — AB (ref <10)
Yeast Wet Prep HPF POC: NONE SEEN

## 2022-04-04 LAB — HCG, QUANTITATIVE, PREGNANCY: hCG, Beta Chain, Quant, S: 678 m[IU]/mL — ABNORMAL HIGH (ref <5)

## 2022-04-04 MED ORDER — METRONIDAZOLE 500 MG PO TABS: 500.0000 mg | ORAL_TABLET | Freq: Two times a day (BID) | ORAL | 0 refills | Status: DC

## 2022-04-04 NOTE — MAU Provider Note (Signed)
History     CSN: [id number]  Arrival date and time: 04/04/22 1619   Event Date/Time   First Provider Initiated Contact with Patient 04/04/22 1732      Chief Complaint  Patient presents with   Vaginal Bleeding   HPI Carolyn Bell is a 22 y.o. year old G7P0 female at [redacted]w[redacted]d weeks gestation who presents to MAU reporting she thinks she is miscarrying. She was in MAU a week ago for spotting and cramping. She was told there was a IUGS that measured 5.6 wks, but fetus or heartbeat was seen. She was dx'd with chlamydia and she also took the medication for treatment. She reports the VB had gotten lighter and pink. She reports the the VB then became red, passing blood clots today. She denies recent SI. She receives [facility] with [facility]; next appt is 04/10/2022.   OB History     Gravida  1   Para      Term      Preterm      AB      Living         SAB      IAB      Ectopic      Multiple      Live Births              Past Medical History:  Diagnosis Date   Asthma    Broken bones    finger, left ankel   Eczema     Past Surgical History:  Procedure Laterality Date   BREAST REDUCTION SURGERY Bilateral 10/05/2019   Procedure: MAMMARY REDUCTION  (BREAST) left - 1812 grams, right - 1705 grams;  Surgeon: Cindra Presume, MD;  Location: [facility];  Service: Plastics;  Laterality: Bilateral;  2.5 hours, please   HERNIA REPAIR      Family History  Problem Relation Age of Onset   Hypertension Mother    Hypertension Maternal Grandmother    Hypertension Maternal Grandfather    Hypertension Paternal Grandfather     Social History   Tobacco Use   Smoking status: Never   Smokeless tobacco: Never  Vaping Use   Vaping Use: Former   Substances: Nicotine, Flavoring  Substance Use Topics   Alcohol use: Yes    Comment: occassionally   Drug use: Yes    Types: Marijuana    Comment: occassional    Allergies: No Known Allergies  Medications  Prior to Admission  Medication Sig Dispense Refill Last Dose   acetaminophen (TYLENOL) 500 MG tablet Take 1 tablet (500 mg total) by mouth every 6 (six) hours as needed. 30 tablet 0 04/04/2022   albuterol (VENTOLIN HFA) 108 (90 Base) MCG/ACT inhaler Inhale 1-2 puffs into the lungs every 6 (six) hours as needed for wheezing or shortness of breath. 18 g 0 Past Month   terconazole (TERAZOL 3) 0.8 % vaginal cream Place 1 applicator vaginally at bedtime. Apply nightly for three nights. 20 g 0     Review of Systems  Constitutional: Negative.   HENT: Negative.    Eyes: Negative.   Respiratory: Negative.    Cardiovascular: Negative.   Gastrointestinal: Negative.   Endocrine: Negative.   Genitourinary:  Positive for pelvic pain and vaginal bleeding.  Musculoskeletal: Negative.   Skin: Negative.   Allergic/Immunologic: Negative.   Neurological: Negative.   Hematological: Negative.   Psychiatric/Behavioral: Negative.     Physical Exam   Blood pressure 124/76, pulse 66, temperature 98.4 F (36.9 C),  temperature source Oral, resp. rate 18, height 5\' 4"  (1.626 m), weight 105.5 kg, last menstrual period 01/29/2022, SpO2 99 %.  Physical Exam Vitals and nursing note reviewed. Exam conducted with a chaperone present.  Constitutional:      Appearance: Normal appearance. She is obese.  Cardiovascular:     Rate and Rhythm: Normal rate.  Pulmonary:     Effort: Pulmonary effort is normal.  Abdominal:     Palpations: Abdomen is soft.  Genitourinary:    General: Normal vulva.     Comments: Pelvic exam: External genitalia normal, SE: vaginal walls pink and well rugated, cervix is smooth, pink, no lesions, small amt of clear pink to red, mucousy red blood with very small clots d/c -- WP, GC/CT done, cervix visually closed, Uterus is non-tender, no CMT or friability, no adnexal tenderness.  Musculoskeletal:        General: Normal range of motion.  Skin:    General: Skin is warm and dry.   Neurological:     Mental Status: She is alert and oriented to person, place, and time.  Psychiatric:        Mood and Affect: Mood normal.        Behavior: Behavior normal.        Thought Content: Thought content normal.        Judgment: Judgment normal.    MAU Course  Procedures  MDM CCUA CBC HCG Wet Prep GC/CT -- Results pending  OB <14 wks U/S  Results for orders placed or performed during the hospital encounter of 04/04/22 (from the past 24 hour(s))  Wet prep, genital     Status: Abnormal   Collection Time: 04/04/22  5:48 PM  Result Value Ref Range   Yeast Wet Prep HPF POC NONE SEEN NONE SEEN   Trich, Wet Prep NONE SEEN NONE SEEN   Clue Cells Wet Prep HPF POC PRESENT (A) NONE SEEN   WBC, Wet Prep HPF POC >=10 (A) <10   Sperm NONE SEEN   Urinalysis, Routine w reflex microscopic -Urine, Clean Catch     Status: Abnormal   Collection Time: 04/04/22  5:48 PM  Result Value Ref Range   Color, Urine YELLOW YELLOW   APPearance HAZY (A) CLEAR   Specific Gravity, Urine 1.024 1.005 - 1.030   pH 6.0 5.0 - 8.0   Glucose, UA NEGATIVE NEGATIVE mg/dL   Hgb urine dipstick SMALL (A) NEGATIVE   Bilirubin Urine NEGATIVE NEGATIVE   Ketones, ur NEGATIVE NEGATIVE mg/dL   Protein, ur NEGATIVE NEGATIVE mg/dL   Nitrite NEGATIVE NEGATIVE   Leukocytes,Ua NEGATIVE NEGATIVE   RBC / HPF 21-50 0 - 5 RBC/hpf   WBC, UA 0-5 0 - 5 WBC/hpf   Bacteria, UA RARE (A) NONE SEEN   Squamous Epithelial / HPF 0-5 0 - 5 /HPF   Mucus PRESENT   CBC     Status: None   Collection Time: 04/04/22  5:50 PM  Result Value Ref Range   WBC 6.0 4.0 - 10.5 K/uL   RBC 4.29 3.87 - 5.11 MIL/uL   Hemoglobin 12.0 12.0 - 15.0 g/dL   HCT 38.6 36.0 - 46.0 %   MCV 90.0 80.0 - 100.0 fL   MCH 28.0 26.0 - 34.0 pg   MCHC 31.1 30.0 - 36.0 g/dL   RDW 13.5 11.5 - 15.5 %   Platelets 333 150 - 400 K/uL   nRBC 0.0 0.0 - 0.2 %     US OB Transvaginal  Result Date:  04/04/2022 CLINICAL DATA:  Heavy vaginal bleeding.  Passing  clots. EXAM: TRANSVAGINAL OB ULTRASOUND TECHNIQUE: Transvaginal ultrasound was performed for complete evaluation of the gestation as well as the maternal uterus, adnexal regions, and pelvic cul-de-sac. COMPARISON:  03/29/2022 FINDINGS: Intrauterine gestational sac: None Yolk sac:  N/A Embryo:  N/A Cardiac Activity: N/A Heart Rate: N/A bpm Subchorionic hemorrhage:  None visualized. Maternal uterus/adnexae: Widened endometrial canal filled with mixed echogenic debris likely a combination of products of gestation and hematoma. Findings consistent with on going spontaneous abortion. Both ovaries are normal.  Trace free pelvic fluid. IMPRESSION: 1. No intrauterine gestational sac is identified. 2. Widened endometrial canal filled with mixed echogenic debris consistent with ongoing spontaneous abortion. 3. Normal ovaries. Electronically Signed   By: Rudie Meyer M.D.   On: 04/04/2022 19:00    Assessment and Plan  1. Vaginal bleeding in pregnancy, first trimester - Return to MAU: If you have heavier bleeding that soaks through more that 2 pads per hour for an hour or more If you bleed so much that you feel like you might pass out or you do pass out If you have significant abdominal pain that is not improved with Tylenol 1000 mg every 8 hours as needed for pain If you develop a fever > 100.5   2. Miscarriage at 8 to [redacted] weeks gestation - Information provided on miscarriage - Weekly HCG draws in the office starting next week   3. Bacterial vaginosis - Information provided on BV   4. [redacted] weeks gestation of pregnancy   - Discharge patient - Advised someone from [place] will notify her if her appt will be changed to a different day or kept on the same day for HCG draw next week - Patient and her mother verbalized an understanding of the plan of care and agrees.   Raelyn Mora, CNM 04/04/2022, 5:32 PM

## 2022-04-04 NOTE — MAU Note (Signed)
Carolyn Bell is a 22 y.o. at [redacted]w[redacted]d here in MAU reporting: thinks she is miscarrying. Was here a wk ago, rec'd call regarding + Chlamydia, took meds.  Bleeding got heavier today,  rating it an 8/10, unable to stay at work.  Bleeding had gotten lighter and was pink, is now red and passing clots.   Onset of complaint: this morning Pain score: 8 Vitals:   04/04/22 1636  BP: 124/65  Pulse: 80  Resp: 18  Temp: 98.4 F (36.9 C)  SpO2: 99%      Lab orders placed from triage:  none

## 2022-04-04 NOTE — Discharge Instructions (Signed)
Return to MAU: If you have heavier bleeding that soaks through more that 2 pads per hour for an hour or more If you bleed so much that you feel like you might pass out or you do pass out If you have significant abdominal pain that is not improved with Tylenol 1000 mg every 8 hours as needed for pain If you develop a fever > 100.5  

## 2022-04-05 LAB — GC/CHLAMYDIA PROBE AMP (~~LOC~~) NOT AT ARMC
Chlamydia: POSITIVE — AB
Comment: NEGATIVE
Comment: NORMAL
Neisseria Gonorrhea: NEGATIVE

## 2022-04-10 ENCOUNTER — Other Ambulatory Visit: Payer: Medicaid Other

## 2022-04-10 DIAGNOSIS — O039 Complete or unspecified spontaneous abortion without complication: Secondary | ICD-10-CM

## 2022-04-11 ENCOUNTER — Telehealth: Payer: Self-pay

## 2022-04-11 LAB — BETA HCG QUANT (REF LAB): hCG Quant: 38 m[IU]/mL

## 2022-04-11 NOTE — Telephone Encounter (Signed)
Attempted to contact x2 about hcg results and follow up labs. No answer, "call cannot be completed at this time", advised scheduler.

## 2022-04-17 ENCOUNTER — Other Ambulatory Visit: Payer: Medicaid Other

## 2022-04-24 ENCOUNTER — Ambulatory Visit: Payer: Medicaid Other | Admitting: Obstetrics and Gynecology

## 2022-04-30 ENCOUNTER — Encounter: Payer: Medicaid Other | Admitting: Advanced Practice Midwife

## 2022-05-01 ENCOUNTER — Ambulatory Visit: Payer: Medicaid Other | Admitting: Obstetrics

## 2022-05-03 ENCOUNTER — Ambulatory Visit (INDEPENDENT_AMBULATORY_CARE_PROVIDER_SITE_OTHER): Payer: Medicaid Other | Admitting: Obstetrics

## 2022-05-03 ENCOUNTER — Other Ambulatory Visit (HOSPITAL_COMMUNITY)
Admission: RE | Admit: 2022-05-03 | Discharge: 2022-05-03 | Disposition: A | Discharge status: Home / Self Care | Payer: Medicaid Other | Source: Ambulatory Visit | Attending: Advanced Practice Midwife | Admitting: Advanced Practice Midwife

## 2022-05-03 ENCOUNTER — Encounter: Payer: Self-pay | Admitting: Obstetrics

## 2022-05-03 VITALS — BP 110/70 | HR 69 | Ht 64.0 in | Wt 240.0 lb

## 2022-05-03 DIAGNOSIS — Z8619 Personal history of other infectious and parasitic diseases: Secondary | ICD-10-CM | POA: Insufficient documentation

## 2022-05-03 DIAGNOSIS — O039 Complete or unspecified spontaneous abortion without complication: Secondary | ICD-10-CM | POA: Diagnosis not present

## 2022-05-03 DIAGNOSIS — Z6841 Body Mass Index (BMI) 40.0 and over, adult: Secondary | ICD-10-CM | POA: Diagnosis not present

## 2022-05-03 NOTE — Progress Notes (Signed)
Patient ID: Carolyn Bell, female   DOB: 04-16-2000, 22 y.o.   MRN: [id number]  Chief Complaint  Patient presents with   Miscarriage    HPI Carolyn Bell is a 22 y.o. female.  Follow up from SAB 1 month ago.  Complains of vaginal discharge.  Denies pelvic pain, abnormal vaginal bleeding or dysuria. HPI  Past Medical History:  Diagnosis Date   Asthma    Broken bones    finger, left ankel   Eczema     Past Surgical History:  Procedure Laterality Date   BREAST REDUCTION SURGERY Bilateral 10/05/2019   Procedure: MAMMARY REDUCTION  (BREAST) left - 1812 grams, right - 1705 grams;  Surgeon: Cindra Presume, MD;  Location: [facility];  Service: Plastics;  Laterality: Bilateral;  2.5 hours, please   HERNIA REPAIR      Family History  Problem Relation Age of Onset   Hypertension Mother    Hypertension Maternal Grandmother    Hypertension Maternal Grandfather    Hypertension Paternal Grandfather     Social History Social History   Tobacco Use   Smoking status: Never   Smokeless tobacco: Current  Vaping Use   Vaping Use: Former   Substances: Nicotine, Flavoring  Substance Use Topics   Alcohol use: Yes    Comment: occassionally   Drug use: Yes    Types: Marijuana    Comment: occassional    No Known Allergies  Current Outpatient Medications  Medication Sig Dispense Refill   acetaminophen (TYLENOL) 500 MG tablet Take 1 tablet (500 mg total) by mouth every 6 (six) hours as needed. 30 tablet 0   albuterol (VENTOLIN HFA) 108 (90 Base) MCG/ACT inhaler Inhale 1-2 puffs into the lungs every 6 (six) hours as needed for wheezing or shortness of breath. 18 g 0   metroNIDAZOLE (FLAGYL) 500 MG tablet Take 1 tablet (500 mg total) by mouth 2 (two) times daily. (Patient not taking: Reported on 05/03/2022) 14 tablet 0   terconazole (TERAZOL 3) 0.8 % vaginal cream Place 1 applicator vaginally at bedtime. Apply nightly for three nights. (Patient not taking:  Reported on 05/03/2022) 20 g 0   No current facility-administered medications for this visit.    Review of Systems Review of Systems Constitutional: negative for fatigue and weight loss Respiratory: negative for cough and wheezing Cardiovascular: negative for chest pain, fatigue and palpitations Gastrointestinal: negative for abdominal pain and change in bowel habits Genitourinary: positive for vaginal discharge Integument/breast: negative for nipple discharge Musculoskeletal:negative for myalgias Neurological: negative for gait problems and tremors Behavioral/Psych: negative for abusive relationship, depression Endocrine: negative for temperature intolerance      Blood pressure 110/70, pulse 69, height 5' 4"$  (1.626 m), weight 240 lb (108.9 kg), last menstrual period 05/03/2022, unknown if currently breastfeeding.  Physical Exam Physical Exam General:   alert  Skin:   no rash or abnormalities  Lungs:   clear to auscultation bilaterally  Heart:   regular rate and rhythm, S1, S2 normal, no murmur, click, rub or gallop  Breasts:   Not examined  Abdomen:  normal findings: no organomegaly, soft, non-tender and no hernia  Pelvis:  External genitalia: normal general appearance Urinary system: urethral meatus normal and bladder without fullness, nontender Vaginal: normal without tenderness, induration or masses Cervix: normal appearance Adnexa: normal bimanual exam Uterus: anteverted and non-tender, normal size    I have spent a total of 20 minutes of face-to-face time, excluding clinical staff time, reviewing notes and preparing to  see patient, ordering tests and/or medications, and counseling the patient.   Data Reviewed Wet Prep  Beta HCG  Assessment     1. SAB (spontaneous abortion) Rx: - Beta hCG quant (ref lab)  2. History of chlamydia Rx: - Cervicovaginal ancillary only( [facility])  3. Class 3 severe obesity without serious comorbidity with body mass index (BMI) of  40.0 to 44.9 in adult, unspecified obesity type ([place]) - weight reduction recommended     Plan   Follow up in 3 months  Orders Placed This Encounter  Procedures   Beta hCG quant (ref lab)     Shelly Bombard, MD 05/03/2022 11:42 AM

## 2022-05-03 NOTE — Progress Notes (Addendum)
22 y.o. GYN present for FU of SAB.  Denies pain, fever, NV, chills.  Pt was treated 04/02/22 for +Chlamydia.

## 2022-05-04 LAB — CERVICOVAGINAL ANCILLARY ONLY
Bacterial Vaginitis (gardnerella): NEGATIVE
Candida Glabrata: NEGATIVE
Candida Vaginitis: POSITIVE — AB
Chlamydia: POSITIVE — AB
Comment: NEGATIVE
Comment: NEGATIVE
Comment: NEGATIVE
Comment: NEGATIVE
Comment: NEGATIVE
Comment: NORMAL
Neisseria Gonorrhea: NEGATIVE
Trichomonas: NEGATIVE

## 2022-05-04 LAB — BETA HCG QUANT (REF LAB): hCG Quant: <1 m[IU]/mL

## 2022-05-08 ENCOUNTER — Other Ambulatory Visit: Payer: Self-pay | Admitting: Obstetrics

## 2022-05-08 DIAGNOSIS — B3731 Acute candidiasis of vulva and vagina: Secondary | ICD-10-CM

## 2022-05-08 DIAGNOSIS — A749 Chlamydial infection, unspecified: Secondary | ICD-10-CM

## 2022-05-08 MED ORDER — FLUCONAZOLE 200 MG PO TABS: 200.0000 mg | ORAL_TABLET | ORAL | 2 refills | Status: DC

## 2022-05-08 MED ORDER — DOXYCYCLINE HYCLATE 100 MG PO CAPS: 100.0000 mg | ORAL_CAPSULE | Freq: Two times a day (BID) | ORAL | 0 refills | Status: DC

## 2022-05-10 ENCOUNTER — Telehealth: Payer: Self-pay

## 2022-05-10 NOTE — Telephone Encounter (Signed)
Attempted to contact about results, no answer, left vm

## 2022-06-09 ENCOUNTER — Encounter (HOSPITAL_COMMUNITY): Payer: Self-pay

## 2022-06-09 ENCOUNTER — Ambulatory Visit (HOSPITAL_COMMUNITY)
Admission: RE | Admit: 2022-06-09 | Discharge: 2022-06-09 | Disposition: A | Discharge status: Home / Self Care | Payer: Medicaid Other | Source: Ambulatory Visit | Attending: Family Medicine | Admitting: Family Medicine

## 2022-06-09 VITALS — BP 112/75 | HR 110 | Temp 99.7°F | Resp 18 | Ht 64.0 in | Wt 241.0 lb

## 2022-06-09 DIAGNOSIS — J039 Acute tonsillitis, unspecified: Secondary | ICD-10-CM | POA: Diagnosis not present

## 2022-06-09 LAB — POCT RAPID STREP A, ED / UC: Streptococcus, Group A Screen (Direct): NEGATIVE

## 2022-06-09 MED ORDER — AMOXICILLIN-POT CLAVULANATE 875-125 MG PO TABS: 1.0000 | ORAL_TABLET | Freq: Two times a day (BID) | ORAL | 0 refills | Status: AC

## 2022-06-09 MED ORDER — ACETAMINOPHEN 325 MG PO TABS
ORAL_TABLET | ORAL | Status: AC
  Filled 2022-06-09: qty 2

## 2022-06-09 MED ORDER — PREDNISONE 20 MG PO TABS: ORAL_TABLET | ORAL | 0 refills | Status: AC

## 2022-06-09 MED ORDER — ACETAMINOPHEN 325 MG PO TABS
650.0000 mg | ORAL_TABLET | Freq: Once | ORAL | Status: AC
  Administered 2022-06-09: 650 mg via ORAL

## 2022-06-09 NOTE — Discharge Instructions (Addendum)
Instructed patient to take medications as directed with food to completion.  Advised patient to take prednisone with first dose of Augmentin for the next 5 of 7 days.  Encouraged increase daily water intake to 64 ounces per day while taking these medications.  Advised if symptoms worsen and/or unresolved please follow-up with PCP or here for further evaluation.

## 2022-06-09 NOTE — ED Triage Notes (Signed)
Mother and sister tested positive for strep. Patient having sore throat, chills, fever, and white patches on the tonsils. Onset of symptoms yesterday morning. Patient had fever of 100.9 this morning.  Pain with swallowing and talking and post nasal drip.    Patient given tylenol, ibuprofen, Theraflu with no relief.

## 2022-06-09 NOTE — ED Provider Notes (Signed)
[facility]    CSN: [id number] Arrival date & time: 06/09/22  1556      History   Chief Complaint Chief Complaint  Patient presents with   Sore Throat   Appointment    HPI Carolyn Bell is a 22 y.o. female.   HPI Pleasant 22 year old female presents with sore throat and fever for 2 days.  Patient reports mother and sister have tested positive for strep pharyngitis and has white patches over her tonsils currently.  Reports fever of 100.9 this morning.  PMH significant for obesity and asthma.  Past Medical History:  Diagnosis Date   Asthma    Broken bones    finger, left ankel   Eczema     There are no problems to display for this patient.   Past Surgical History:  Procedure Laterality Date   BREAST REDUCTION SURGERY Bilateral 10/05/2019   Procedure: MAMMARY REDUCTION  (BREAST) left - 1812 grams, right - 1705 grams;  Surgeon: Cindra Presume, MD;  Location: [facility];  Service: Plastics;  Laterality: Bilateral;  2.5 hours, please   HERNIA REPAIR      OB History     Gravida  1   Para      Term      Preterm      AB      Living         SAB      IAB      Ectopic      Multiple      Live Births               Home Medications    Prior to Admission medications   Medication Sig Start Date End Date Taking? Authorizing Provider  acetaminophen (TYLENOL) 500 MG tablet Take 1 tablet (500 mg total) by mouth every 6 (six) hours as needed. 02/02/22  Yes Varney Biles, MD  albuterol (VENTOLIN HFA) 108 (90 Base) MCG/ACT inhaler Inhale 1-2 puffs into the lungs every 6 (six) hours as needed for wheezing or shortness of breath. 04/01/19  Yes Jaynee Eagles, PA-C  amoxicillin-clavulanate (AUGMENTIN) 875-125 MG tablet Take 1 tablet by mouth every 12 (twelve) hours. 06/09/22  Yes Eliezer Lofts, FNP  predniSONE (DELTASONE) 20 MG tablet Take 3 tabs p.o. daily x 5 days. 06/09/22  Yes Eliezer Lofts, FNP  terconazole (TERAZOL 3) 0.8 %  vaginal cream Place 1 applicator vaginally at bedtime. Apply nightly for three nights. 03/29/22   Darlina Rumpf, CNM    Family History Family History  Problem Relation Age of Onset   Hypertension Mother    Hypertension Maternal Grandmother    Hypertension Maternal Grandfather    Hypertension Paternal Grandfather     Social History Social History   Tobacco Use   Smoking status: Never   Smokeless tobacco: Current  Vaping Use   Vaping Use: Former   Substances: Nicotine, Flavoring  Substance Use Topics   Alcohol use: Yes    Comment: occassionally   Drug use: Yes    Types: Marijuana    Comment: occassional     Allergies   Anesthetics, amide   Review of Systems Review of Systems  HENT:  Positive for sore throat.   All other systems reviewed and are negative.    Physical Exam Triage Vital Signs ED Triage Vitals  Enc Vitals Group     BP 06/09/22 1611 112/75     Pulse Rate 06/09/22 1611 (!) 110     Resp 06/09/22  1611 18     Temp 06/09/22 1611 99.7 F (37.6 C)     Temp Source 06/09/22 1611 Oral     SpO2 06/09/22 1611 96 %     Weight 06/09/22 1611 241 lb (109.3 kg)     Height 06/09/22 1611 5\' 4"  (1.626 m)     Head Circumference --      Peak Flow --      Pain Score 06/09/22 1608 8     Pain Loc --      Pain Edu? --      Excl. in [place]? --    No data found.  Updated Vital Signs BP 112/75 (BP Location: Left Arm)   Pulse (!) 110   Temp 99.7 F (37.6 C) (Oral)   Resp 18   Ht 5\' 4"  (1.626 m)   Wt 241 lb (109.3 kg)   LMP 05/29/2022 (Exact Date)   SpO2 96%   Breastfeeding No   BMI 41.37 kg/m    Physical Exam Vitals reviewed.  Constitutional:      Appearance: She is well-developed. She is obese. She is ill-appearing.  HENT:     Head: Normocephalic and atraumatic.     Right Ear: Tympanic membrane, ear canal and external ear normal.     Left Ear: Tympanic membrane, ear canal and external ear normal.     Mouth/Throat:     Mouth: Mucous membranes are  moist.     Pharynx: Uvula midline. Oropharyngeal exudate, posterior oropharyngeal erythema and uvula swelling present.     Tonsils: 4+ on the right. 4+ on the left.  Eyes:     Conjunctiva/sclera: Conjunctivae normal.     Pupils: Pupils are equal, round, and reactive to light.  Cardiovascular:     Rate and Rhythm: Normal rate and regular rhythm.     Pulses: Normal pulses.     Heart sounds: Normal heart sounds. No murmur heard. Pulmonary:     Effort: Pulmonary effort is normal.     Breath sounds: Normal breath sounds.  Musculoskeletal:        General: Normal range of motion.     Cervical back: Normal range of motion and neck supple. No tenderness.  Lymphadenopathy:     Cervical: Cervical adenopathy present.  Skin:    General: Skin is warm and dry.  Neurological:     General: No focal deficit present.     Mental Status: She is alert and oriented to person, place, and time.  Psychiatric:        Mood and Affect: Mood normal.        Behavior: Behavior normal.      UC Treatments / Results  Labs (all labs ordered are listed, but only abnormal results are displayed) Labs Reviewed  POCT RAPID STREP A, ED / UC    EKG   Radiology No results found.  Procedures Procedures (including critical care time)  Medications Ordered in UC Medications  acetaminophen (TYLENOL) tablet 650 mg (650 mg Oral Given 06/09/22 1634)    Initial Impression / Assessment and Plan / UC Course  I have reviewed the triage vital signs and the nursing notes.  Pertinent labs & imaging results that were available during my care of the patient were reviewed by me and considered in my medical decision making (see chart for details).     MDM: 1. Tonsillitis-Rx'd Augmentin 875/125 mg twice daily x 7 days; prednisone 60 mg daily x 5 days. Instructed patient to take medications as directed with  food to completion.  Advised patient to take prednisone with first dose of Augmentin for the next 5 of 7 days.   Encouraged increase daily water intake to 64 ounces per day while taking these medications.  Advised if symptoms worsen and/or unresolved please follow-up with PCP or here for further evaluation.  Work note provided to patient prior to discharge.  Patient discharged home, hemodynamically stable. Final Clinical Impressions(s) / UC Diagnoses   Final diagnoses:  Tonsillitis     Discharge Instructions      Instructed patient to take medications as directed with food to completion.  Advised patient to take prednisone with first dose of Augmentin for the next 5 of 7 days.  Encouraged increase daily water intake to 64 ounces per day while taking these medications.  Advised if symptoms worsen and/or unresolved please follow-up with PCP or here for further evaluation.     ED Prescriptions     Medication Sig Dispense Auth. Provider   amoxicillin-clavulanate (AUGMENTIN) 875-125 MG tablet Take 1 tablet by mouth every 12 (twelve) hours. 14 tablet Eliezer Lofts, FNP   predniSONE (DELTASONE) 20 MG tablet Take 3 tabs p.o. daily x 5 days. 15 tablet Eliezer Lofts, FNP      PDMP not reviewed this encounter.   Eliezer Lofts, [place] 06/09/22 Jamal Maes    Eliezer Lofts, [place] 06/11/22 [phone]

## 2022-08-01 ENCOUNTER — Ambulatory Visit: Payer: Medicaid Other

## 2023-01-20 ENCOUNTER — Emergency Department (HOSPITAL_COMMUNITY)
Admission: EM | Admit: 2023-01-20 | Discharge: 2023-01-20 | Disposition: A | Discharge status: Home / Self Care | Payer: Medicaid Other | Attending: Emergency Medicine | Admitting: Emergency Medicine

## 2023-01-20 ENCOUNTER — Other Ambulatory Visit: Payer: Self-pay

## 2023-01-20 ENCOUNTER — Emergency Department (HOSPITAL_COMMUNITY): Payer: Medicaid Other

## 2023-01-20 ENCOUNTER — Encounter (HOSPITAL_COMMUNITY): Payer: Self-pay

## 2023-01-20 DIAGNOSIS — Z7952 Long term (current) use of systemic steroids: Secondary | ICD-10-CM | POA: Diagnosis not present

## 2023-01-20 DIAGNOSIS — R112 Nausea with vomiting, unspecified: Secondary | ICD-10-CM | POA: Diagnosis not present

## 2023-01-20 DIAGNOSIS — H9201 Otalgia, right ear: Secondary | ICD-10-CM | POA: Insufficient documentation

## 2023-01-20 DIAGNOSIS — J45909 Unspecified asthma, uncomplicated: Secondary | ICD-10-CM | POA: Insufficient documentation

## 2023-01-20 DIAGNOSIS — J069 Acute upper respiratory infection, unspecified: Secondary | ICD-10-CM | POA: Insufficient documentation

## 2023-01-20 DIAGNOSIS — Z20822 Contact with and (suspected) exposure to covid-19: Secondary | ICD-10-CM | POA: Insufficient documentation

## 2023-01-20 DIAGNOSIS — R059 Cough, unspecified: Secondary | ICD-10-CM | POA: Diagnosis present

## 2023-01-20 LAB — RESP PANEL BY RT-PCR (RSV, FLU A&B, COVID)  RVPGX2
Influenza A by PCR: NEGATIVE
Influenza B by PCR: NEGATIVE
Resp Syncytial Virus by PCR: NEGATIVE
SARS Coronavirus 2 by RT PCR: NEGATIVE

## 2023-01-20 LAB — GROUP A STREP BY PCR: Group A Strep by PCR: NOT DETECTED

## 2023-01-20 LAB — PREGNANCY, URINE: Preg Test, Ur: NEGATIVE

## 2023-01-20 MED ORDER — ONDANSETRON 4 MG PO TBDP: 4.0000 mg | ORAL_TABLET | Freq: Three times a day (TID) | ORAL | 0 refills | Status: AC | PRN

## 2023-01-20 MED ORDER — ONDANSETRON 4 MG PO TBDP
4.0000 mg | ORAL_TABLET | Freq: Once | ORAL | Status: AC
  Administered 2023-01-20: 4 mg via ORAL
  Filled 2023-01-20: qty 1

## 2023-01-20 NOTE — ED Provider Notes (Signed)
[place] EMERGENCY DEPARTMENT AT [facility] Provider Note   CSN: [id number] Arrival date & time: 01/20/23  7829     History {Add pertinent medical, surgical, social history, OB history to HPI:1} Chief Complaint  Patient presents with   Emesis    Carolyn Bell is a 22 y.o. female.  22 year old female with a history of asthma who presents to the emergency department with cough, nausea and vomiting, and ear pain.  Patient reports that a week ago she started experiencing cough productive of yellow sputum.  Also with some subjective fevers.  Would occasionally have posttussive emesis and she was taking Zofran which improved her nausea and vomiting but ran out recently and has been having vomiting since.  No abdominal pain.  No diarrhea recently.  No urinary symptoms.  No abdominal surgeries.  Says that her right ear feels congested and that she is having difficulty hearing out of it.  Seen 01/13/2023 at urgent care and was diagnosed with bronchitis and was started on prednisone 40 mg daily, amoxicillin, and albuterol.       Home Medications Prior to Admission medications   Medication Sig Start Date End Date Taking? Authorizing Provider  acetaminophen (TYLENOL) 500 MG tablet Take 1 tablet (500 mg total) by mouth every 6 (six) hours as needed. 02/02/22   Derwood Kaplan, MD  albuterol (VENTOLIN HFA) 108 (90 Base) MCG/ACT inhaler Inhale 1-2 puffs into the lungs every 6 (six) hours as needed for wheezing or shortness of breath. 04/01/19   Wallis Bamberg, PA-C  amoxicillin-clavulanate (AUGMENTIN) 875-125 MG tablet Take 1 tablet by mouth every 12 (twelve) hours. 06/09/22   Trevor Iha, FNP  predniSONE (DELTASONE) 20 MG tablet Take 3 tabs p.o. daily x 5 days. 06/09/22   Trevor Iha, FNP  terconazole (TERAZOL 3) 0.8 % vaginal cream Place 1 applicator vaginally at bedtime. Apply nightly for three nights. 03/29/22   Calvert Cantor, CNM      Allergies    Anesthetics,  amide    Review of Systems   Review of Systems  Physical Exam Updated Vital Signs BP 123/82   Pulse (!) 106   Temp 98.7 F (37.1 C) (Oral)   Resp 16   Ht 5\' 4"  (1.626 m)   Wt 108.9 kg   LMP 01/02/2022   SpO2 95%   BMI 41.20 kg/m  Physical Exam Vitals and nursing note reviewed.  Constitutional:      General: She is not in acute distress.    Appearance: She is well-developed.     Comments: Coughing frequently during exam  HENT:     Head: Normocephalic and atraumatic.     Right Ear: Ear canal and external ear normal.     Left Ear: Tympanic membrane, ear canal and external ear normal.     Ears:     Comments: Right TM erythematous    Nose: Congestion present.  Eyes:     Extraocular Movements: Extraocular movements intact.     Conjunctiva/sclera: Conjunctivae normal.     Pupils: Pupils are equal, round, and reactive to light.  Cardiovascular:     Rate and Rhythm: Normal rate and regular rhythm.     Heart sounds: No murmur heard. Pulmonary:     Effort: Pulmonary effort is normal. No respiratory distress.     Breath sounds: Normal breath sounds.  Abdominal:     General: Abdomen is flat. There is no distension.     Palpations: Abdomen is soft. There is no mass.  Tenderness: There is no abdominal tenderness. There is no guarding.  Musculoskeletal:     Cervical back: Normal range of motion and neck supple.  Skin:    General: Skin is warm and dry.  Neurological:     Mental Status: She is alert and oriented to person, place, and time. Mental status is at baseline.  Psychiatric:        Mood and Affect: Mood normal.     ED Results / Procedures / Treatments   Labs (all labs ordered are listed, but only abnormal results are displayed) Labs Reviewed  GROUP A STREP BY PCR  RESP PANEL BY RT-PCR (RSV, FLU A&B, COVID)  RVPGX2  PREGNANCY, URINE    EKG None  Radiology No results found.  Procedures Procedures  {Document cardiac monitor, telemetry assessment  procedure when appropriate:1}  Medications Ordered in ED Medications  ondansetron (ZOFRAN-ODT) disintegrating tablet 4 mg (has no administration in time range)    ED Course/ Medical Decision Making/ A&P   {   Click here for ABCD2, HEART and other calculatorsREFRESH Note before signing :1}                              Medical Decision Making Amount and/or Complexity of Data Reviewed Labs: ordered. Radiology: ordered.  Risk Prescription drug management.   ***  {Document critical care time when appropriate:1} {Document review of labs and clinical decision tools ie heart score, Chads2Vasc2 etc:1}  {Document your independent review of radiology images, and any outside records:1} {Document your discussion with family members, caretakers, and with consultants:1} {Document social determinants of health affecting pt's care:1} {Document your decision making why or why not admission, treatments were needed:1} Final Clinical Impression(s) / ED Diagnoses Final diagnoses:  None    Rx / DC Orders ED Discharge Orders     None

## 2023-01-20 NOTE — ED Triage Notes (Signed)
Pt recently diagnosed with bronchitis and is on BX right now. Pt took abx tonight and had emesisx1. Pt completed steroid and still has cough. Pt has rib pain from coughing. C/o not being able to hear out of right ear. Pt symptoms have not improved since medication

## 2023-01-20 NOTE — Discharge Instructions (Addendum)
You were seen for your upper respiratory tract infection in the emergency department.   At home, please take the Zofran for your nausea and vomiting.  Use Tylenol and ibuprofen for muscle aches and fevers.  Please use over-the-counter cough medication or tea with honey for your cough. Use over the counter decongestants.   Follow-up with your primary doctor in 2-3 days regarding your visit.  This may be over the phone if you are feeling better or in person if you are feeling worse.  Return immediately to the emergency department if you experience any of the following: Difficulty breathing, or any other concerning symptoms.    Thank you for visiting our Emergency Department. It was a pleasure taking care of you today.

## 2023-01-28 ENCOUNTER — Other Ambulatory Visit (HOSPITAL_BASED_OUTPATIENT_CLINIC_OR_DEPARTMENT_OTHER): Payer: Self-pay

## 2023-01-28 MED ORDER — NEBULIZER MISC
0 refills | Status: AC
  Filled 2023-01-28 – 2023-01-29 (×2): qty 1, 1d supply, fill #0

## 2023-01-29 ENCOUNTER — Other Ambulatory Visit (HOSPITAL_BASED_OUTPATIENT_CLINIC_OR_DEPARTMENT_OTHER): Payer: Self-pay

## 2023-05-17 ENCOUNTER — Ambulatory Visit
Admission: EM | Admit: 2023-05-17 | Discharge: 2023-05-17 | Disposition: A | Discharge status: Home / Self Care | Attending: Family Medicine | Admitting: Family Medicine

## 2023-05-17 DIAGNOSIS — R197 Diarrhea, unspecified: Secondary | ICD-10-CM | POA: Diagnosis not present

## 2023-05-17 DIAGNOSIS — J101 Influenza due to other identified influenza virus with other respiratory manifestations: Secondary | ICD-10-CM

## 2023-05-17 DIAGNOSIS — R112 Nausea with vomiting, unspecified: Secondary | ICD-10-CM

## 2023-05-17 LAB — POCT INFLUENZA A/B
Influenza A, POC: POSITIVE — AB
Influenza B, POC: NEGATIVE

## 2023-05-17 MED ORDER — METOCLOPRAMIDE HCL 5 MG/ML IJ SOLN
10.0000 mg | Freq: Once | INTRAMUSCULAR | Status: AC
  Administered 2023-05-17: 10 mg via INTRAMUSCULAR

## 2023-05-17 MED ORDER — OSELTAMIVIR PHOSPHATE 75 MG PO CAPS: 75.0000 mg | ORAL_CAPSULE | Freq: Two times a day (BID) | ORAL | 0 refills | Status: AC

## 2023-05-17 NOTE — ED Provider Notes (Signed)
 [facility]    CSN: [id number] Arrival date & time: 05/17/23  1325      History   Chief Complaint Chief Complaint  Patient presents with   Fever    HPI Carolyn Bell is a 23 y.o. female presents for evaluation of nausea and vomiting.  Patient reports 2 days of nonbloody nausea/vomiting/diarrhea as well as fever 102 degrees with bodyaches and chills.  She denies any cough/congestion/sore throat.  No recent travel.  Denies any history of GI diagnoses such as Crohn's, IBS, colitis, diverticulitis.  She has been taking Tylenol and Zofran with minimal improvement.  States she is able to keep water down and is peeing normally.  No dysuria.   Fever Associated symptoms: diarrhea, nausea and vomiting     Past Medical History:  Diagnosis Date   Asthma    Broken bones    finger, left ankel   Eczema     There are no active problems to display for this patient.   Past Surgical History:  Procedure Laterality Date   BREAST REDUCTION SURGERY Bilateral 10/05/2019   Procedure: MAMMARY REDUCTION  (BREAST) left - 1812 grams, right - 1705 grams;  Surgeon: Allena Napoleon, MD;  Location: [facility];  Service: Plastics;  Laterality: Bilateral;  2.5 hours, please   HERNIA REPAIR      OB History     Gravida  1   Para      Term      Preterm      AB      Living         SAB      IAB      Ectopic      Multiple      Live Births               Home Medications    Prior to Admission medications   Medication Sig Start Date End Date Taking? Authorizing Provider  oseltamivir (TAMIFLU) 75 MG capsule Take 1 capsule (75 mg total) by mouth every 12 (twelve) hours. 05/17/23  Yes Radford Pax, NP  acetaminophen (TYLENOL) 500 MG tablet Take 1 tablet (500 mg total) by mouth every 6 (six) hours as needed. 02/02/22   Derwood Kaplan, MD  albuterol (VENTOLIN HFA) 108 (90 Base) MCG/ACT inhaler Inhale 1-2 puffs into the lungs every 6 (six) hours as  needed for wheezing or shortness of breath. 04/01/19   Wallis Bamberg, PA-C  amoxicillin-clavulanate (AUGMENTIN) 875-125 MG tablet Take 1 tablet by mouth every 12 (twelve) hours. 06/09/22   Trevor Iha, FNP  Nebulizer MISC Use as directed 01/28/23     ondansetron (ZOFRAN-ODT) 4 MG disintegrating tablet Take 1 tablet (4 mg total) by mouth every 8 (eight) hours as needed for nausea or vomiting. 01/20/23   Rondel Baton, MD  predniSONE (DELTASONE) 20 MG tablet Take 3 tabs p.o. daily x 5 days. 06/09/22   Trevor Iha, FNP  terconazole (TERAZOL 3) 0.8 % vaginal cream Place 1 applicator vaginally at bedtime. Apply nightly for three nights. 03/29/22   Calvert Cantor, CNM    Family History Family History  Problem Relation Age of Onset   Hypertension Mother    Hypertension Maternal Grandmother    Hypertension Maternal Grandfather    Hypertension Paternal Grandfather     Social History Social History   Tobacco Use   Smoking status: Never   Smokeless tobacco: Current  Vaping Use   Vaping status: Former   Substances: Nicotine,  Flavoring  Substance Use Topics   Alcohol use: Yes    Comment: occassionally   Drug use: Yes    Types: Marijuana    Comment: occassional     Allergies   Anesthetics, amide   Review of Systems Review of Systems  Constitutional:  Positive for fever.  Gastrointestinal:  Positive for diarrhea, nausea and vomiting.     Physical Exam Triage Vital Signs ED Triage Vitals  Encounter Vitals Group     BP 05/17/23 1351 120/70     Systolic BP Percentile --      Diastolic BP Percentile --      Pulse Rate 05/17/23 1351 80     Resp 05/17/23 1351 16     Temp 05/17/23 1351 98.4 F (36.9 C)     Temp Source 05/17/23 1351 Oral     SpO2 05/17/23 1351 98 %     Weight --      Height --      Head Circumference --      Peak Flow --      Pain Score 05/17/23 1347 0     Pain Loc --      Pain Education --      Exclude from Growth Chart --    No data  found.  Updated Vital Signs BP 120/70 (BP Location: Left Arm)   Pulse 80   Temp 98.4 F (36.9 C) (Oral)   Resp 16   LMP 05/11/2023 (Approximate)   SpO2 98%   Visual Acuity Right Eye Distance:   Left Eye Distance:   Bilateral Distance:    Right Eye Near:   Left Eye Near:    Bilateral Near:     Physical Exam Vitals and nursing note reviewed.  Constitutional:      General: She is not in acute distress.    Appearance: Normal appearance. She is not ill-appearing.  HENT:     Head: Normocephalic and atraumatic.  Eyes:     Pupils: Pupils are equal, round, and reactive to light.  Cardiovascular:     Rate and Rhythm: Normal rate.  Pulmonary:     Effort: Pulmonary effort is normal.  Abdominal:     General: Bowel sounds are normal.     Palpations: There is no hepatomegaly or splenomegaly.     Tenderness: There is generalized abdominal tenderness. Negative signs include Rovsing's sign and McBurney's sign.  Skin:    General: Skin is warm and dry.  Neurological:     General: No focal deficit present.     Mental Status: She is alert and oriented to person, place, and time.  Psychiatric:        Mood and Affect: Mood normal.        Behavior: Behavior normal.      UC Treatments / Results  Labs (all labs ordered are listed, but only abnormal results are displayed) Labs Reviewed  POCT INFLUENZA A/B - Abnormal; Notable for the following components:      Result Value   Influenza A, POC Positive (*)    All other components within normal limits    EKG   Radiology No results found.  Procedures Procedures (including critical care time)  Medications Ordered in UC Medications  metoCLOPramide (REGLAN) injection 10 mg (10 mg Intramuscular Given 05/17/23 1604)    Initial Impression / Assessment and Plan / UC Course  I have reviewed the triage vital signs and the nursing notes.  Pertinent labs & imaging results that were available during my  care of the patient were reviewed  by me and considered in my medical decision making (see chart for details).     Reviewed exam and symptoms with patient.  No red flags.  Patient given Reglan injection in clinic for nausea/vomiting.  Positive influenza A, discussed viral illness and symptomatic treatment.  Start Tamiflu.  Advised she may continue her home Zofran and also over-the-counter Imodium as needed.  Advised fluids/electrolyte replacement, rest, and brat diet.  PCP follow-up if symptoms do not improve.  ER precautions reviewed and patient verbalized understanding. Final Clinical Impressions(s) / UC Diagnoses   Final diagnoses:  Nausea and vomiting, unspecified vomiting type  Diarrhea, unspecified type  Influenza A     Discharge Instructions      Start Tamiflu twice daily for 5 days.  You may continue the Zofran you have at home as needed for nausea or vomiting.  You may use over-the-counter Imodium as needed for your diarrhea.  Focus on hydration and electrolyte replacement with Gatorade, Powerade, Pedialyte, water.  Do a bland diet and advance as you tolerate.  Lots of rest and fluids.  Follow-up with your PCP if your symptoms do not improve.  Please go to the ER for any worsening symptoms.  Hope you feel better soon!     ED Prescriptions     Medication Sig Dispense Auth. Provider   oseltamivir (TAMIFLU) 75 MG capsule Take 1 capsule (75 mg total) by mouth every 12 (twelve) hours. 10 capsule Radford Pax, NP      PDMP not reviewed this encounter.   Radford Pax, NP 05/17/23 [phone]

## 2023-05-17 NOTE — ED Triage Notes (Signed)
 Pt states fever,body aches,chills,nausea and vomiting for the past 2 days.  States she has been taking Tylenol and nausea medication at home.

## 2023-05-17 NOTE — Discharge Instructions (Signed)
 Start Tamiflu twice daily for 5 days.  You may continue the Zofran you have at home as needed for nausea or vomiting.  You may use over-the-counter Imodium as needed for your diarrhea.  Focus on hydration and electrolyte replacement with Gatorade, Powerade, Pedialyte, water.  Do a bland diet and advance as you tolerate.  Lots of rest and fluids.  Follow-up with your PCP if your symptoms do not improve.  Please go to the ER for any worsening symptoms.  Hope you feel better soon!
